# Patient Record
Sex: Male | Born: 1990 | Race: Black or African American | Hispanic: No | Marital: Married | Smoking: Never smoker
Health system: Southern US, Community
[De-identification: ages and names within clinical notes are randomized; demographics above are authoritative.]

## PROBLEM LIST (undated history)

## (undated) ENCOUNTER — Emergency Department (HOSPITAL_COMMUNITY): Admission: EM | Payer: Self-pay | Source: Home / Self Care

---

## 2013-07-10 ENCOUNTER — Emergency Department (HOSPITAL_COMMUNITY)
Admission: EM | Admit: 2013-07-10 | Discharge: 2013-07-10 | Discharge status: Absent without leave | Payer: Self-pay | Source: Home / Self Care

## 2014-03-04 ENCOUNTER — Emergency Department (HOSPITAL_COMMUNITY)
Admission: EM | Admit: 2014-03-04 | Discharge: 2014-03-04 | Disposition: A | Discharge status: Home / Self Care | Payer: Self-pay | Source: Home / Self Care

## 2015-06-02 ENCOUNTER — Emergency Department (HOSPITAL_COMMUNITY)
Admission: EM | Admit: 2015-06-02 | Discharge: 2015-06-02 | Disposition: A | Discharge status: Home / Self Care | Payer: BLUE CROSS/BLUE SHIELD | Attending: Emergency Medicine | Admitting: Emergency Medicine

## 2015-06-02 ENCOUNTER — Encounter (HOSPITAL_COMMUNITY): Payer: Self-pay | Admitting: Cardiology

## 2015-06-02 DIAGNOSIS — K219 Gastro-esophageal reflux disease without esophagitis: Secondary | ICD-10-CM | POA: Insufficient documentation

## 2015-06-02 DIAGNOSIS — R197 Diarrhea, unspecified: Secondary | ICD-10-CM

## 2015-06-02 DIAGNOSIS — R1032 Left lower quadrant pain: Secondary | ICD-10-CM | POA: Diagnosis present

## 2015-06-02 LAB — COMPREHENSIVE METABOLIC PANEL
ALBUMIN: 3.9 g/dL (ref 3.5–5.0)
ALK PHOS: 58 U/L (ref 38–126)
ALT: 27 U/L (ref 17–63)
AST: 25 U/L (ref 15–41)
Anion gap: 6 (ref 5–15)
BILIRUBIN TOTAL: 1.1 mg/dL (ref 0.3–1.2)
BUN: 9 mg/dL (ref 6–20)
CALCIUM: 9 mg/dL (ref 8.9–10.3)
CO2: 25 mmol/L (ref 22–32)
CREATININE: 0.71 mg/dL (ref 0.61–1.24)
Chloride: 108 mmol/L (ref 101–111)
GFR calc Af Amer: >60 mL/min (ref >60)
GFR calc non Af Amer: >60 mL/min (ref >60)
GLUCOSE: 88 mg/dL (ref 65–99)
POTASSIUM: 3.9 mmol/L (ref 3.5–5.1)
Sodium: 139 mmol/L (ref 135–145)
TOTAL PROTEIN: 6.7 g/dL (ref 6.5–8.1)

## 2015-06-02 LAB — URINALYSIS, ROUTINE W REFLEX MICROSCOPIC
BILIRUBIN URINE: NEGATIVE
Glucose, UA: NEGATIVE mg/dL
Hgb urine dipstick: NEGATIVE
KETONES UR: NEGATIVE mg/dL
Leukocytes, UA: NEGATIVE
NITRITE: NEGATIVE
PH: 6.5 (ref 5.0–8.0)
Protein, ur: NEGATIVE mg/dL
Specific Gravity, Urine: 1.005 (ref 1.005–1.030)

## 2015-06-02 LAB — CBC
HEMATOCRIT: 39.4 % (ref 39.0–52.0)
Hemoglobin: 13.7 g/dL (ref 13.0–17.0)
MCH: 30 pg (ref 26.0–34.0)
MCHC: 34.8 g/dL (ref 30.0–36.0)
MCV: 86.2 fL (ref 78.0–100.0)
Platelets: 158 10*3/uL (ref 150–400)
RBC: 4.57 MIL/uL (ref 4.22–5.81)
RDW: 12.2 % (ref 11.5–15.5)
WBC: 4.5 10*3/uL (ref 4.0–10.5)

## 2015-06-02 LAB — LIPASE, BLOOD: Lipase: 29 U/L (ref 11–51)

## 2015-06-02 MED ORDER — ONDANSETRON 4 MG PO TBDP: 4.0000 mg | ORAL_TABLET | Freq: Three times a day (TID) | ORAL | Status: DC | PRN

## 2015-06-02 MED ORDER — OMEPRAZOLE 20 MG PO CPDR: 20.0000 mg | DELAYED_RELEASE_CAPSULE | Freq: Every day | ORAL | Status: DC

## 2015-06-02 MED ORDER — LOPERAMIDE HCL 2 MG PO CAPS: 2.0000 mg | ORAL_CAPSULE | ORAL | Status: DC | PRN

## 2015-06-02 NOTE — ED Notes (Signed)
Pt reports left sided abd pain that started about 4 days ago. Also reports some nausea and burning sensation to the abd.

## 2015-06-02 NOTE — ED Provider Notes (Signed)
CSN: [id number]     Arrival date & time 06/02/15  1409 History  By signing my name below, I, Tanda RockersMargaux Venter, attest that this documentation has been prepared under the direction and in the presence of Everlene FarrierWilliam Katlin Ciszewski, PA-C. Electronically Signed: Tanda RockersMargaux Venter, ED Scribe. 06/02/2015. 4:01 PM.   Chief Complaint  Patient presents with  . Abdominal Pain   The history is provided by the patient. The history is limited by a language barrier. A language interpreter was used.     HPI Comments: Donald Prince is a 25 y.o. male who presents to the Emergency Department complaining of sudden onset, intermittent, LLQ abdominal pain x 2 days.  The pain is not worsened after a bowel movement. Pt also complains of an intermittent burning sensation in his epigastrium, abdominal bloating, nausea, and loose stools. He states he has had 1 episode of loose stools today. Pt has been taking OTC acid controlling medication without relief. He reports hx of gastritis and states this pain feels similar. He has been staying well hydrated today. Denies vomiting, fever, hematochezia, hematuria, dysuria, or any other associated symptoms. No hx kidney stones.  No previous abdominal surgeries.    History reviewed. No pertinent past medical history. History reviewed. No pertinent past surgical history. History reviewed. No pertinent family history. Social History  Substance Use Topics  . Smoking status: Never Smoker   . Smokeless tobacco: None  . Alcohol Use: No    Review of Systems  Constitutional: Negative for fever and chills.  HENT: Negative for congestion and sore throat.   Eyes: Negative for visual disturbance.  Respiratory: Negative for cough and shortness of breath.   Cardiovascular: Negative for chest pain.  Gastrointestinal: Positive for nausea, abdominal pain, diarrhea and abdominal distention. Negative for vomiting and blood in stool.  Genitourinary: Negative for dysuria, hematuria, flank pain and  difficulty urinating.  Musculoskeletal: Negative for back pain and neck pain.  Skin: Negative for rash.  Neurological: Negative for syncope, light-headedness and headaches.   Allergies  Review of patient's allergies indicates no known allergies.  Home Medications   Prior to Admission medications   Medication Sig Start Date End Date Taking? Authorizing Provider  loperamide (IMODIUM) 2 MG capsule Take 1 capsule (2 mg total) by mouth as needed for diarrhea or loose stools. 06/02/15   Everlene FarrierWilliam Raekwan Spelman, PA-C  omeprazole (PRILOSEC) 20 MG capsule Take 1 capsule (20 mg total) by mouth daily. 06/02/15   Everlene FarrierWilliam Tashica Provencio, PA-C  ondansetron (ZOFRAN ODT) 4 MG disintegrating tablet Take 1 tablet (4 mg total) by mouth every 8 (eight) hours as needed for nausea or vomiting. 06/02/15   Everlene FarrierWilliam Airel Magadan, PA-C   BP 106/67 mmHg  Pulse 65  Temp(Src) 98.5 F (36.9 C) (Oral)  Resp 20  Wt 61.292 kg  SpO2 100%   Physical Exam  Constitutional: He appears well-developed and well-nourished. No distress.  HENT:  Head: Normocephalic and atraumatic.  Mouth/Throat: Oropharynx is clear and moist and mucous membranes are normal.  Eyes: Conjunctivae are normal. Pupils are equal, round, and reactive to light. Right eye exhibits no discharge. Left eye exhibits no discharge.  Neck: Neck supple.  Cardiovascular: Normal rate, regular rhythm, normal heart sounds and intact distal pulses.  Exam reveals no gallop and no friction rub.   No murmur heard. Pulmonary/Chest: Effort normal and breath sounds normal. No respiratory distress. He has no wheezes. He has no rales.  Abdominal: Soft. Bowel sounds are normal. He exhibits no distension. There is no tenderness. There is  no rebound, no guarding and no CVA tenderness.  Patient's abdomen is soft and nontender to palpation. Bowel sounds are present. No CVA or flank tenderness. No peritoneal signs. No psoas or obturator sign.  Musculoskeletal: He exhibits no edema or tenderness.   Lymphadenopathy:    He has no cervical adenopathy.  Neurological: He is alert. Coordination normal.  Skin: Skin is warm and dry. No rash noted. He is not diaphoretic. No erythema. No pallor.  Psychiatric: He has a normal mood and affect. His behavior is normal.  Nursing note and vitals reviewed.   ED Course  Procedures (including critical care time)  DIAGNOSTIC STUDIES: Oxygen Saturation is 99% on RA, normal by my interpretation.    COORDINATION OF CARE: 3:56 PM-Discussed treatment plan which includes Rx Omeprazole, Zofran, and Loperamide with pt at bedside and pt agreed to plan.   Labs Review Labs Reviewed  LIPASE, BLOOD  COMPREHENSIVE METABOLIC PANEL  CBC  URINALYSIS, ROUTINE W REFLEX MICROSCOPIC (NOT AT [facility])    Imaging Review No results found. I have personally reviewed and evaluated these lab results as part of my medical decision-making.   EKG Interpretation None      Filed Vitals:   06/02/15 1429 06/02/15 1432 06/02/15 1612  BP: 105/75  106/67  Pulse: 62  65  Temp: 98.8 F (37.1 C)  98.5 F (36.9 C)  TempSrc: Oral  Oral  Resp: 18  20  Weight:  61.292 kg   SpO2: 99%  100%     MDM   Meds given in ED:  Medications - No data to display  Discharge Medication List as of 06/02/2015  4:05 PM    START taking these medications   Details  loperamide (IMODIUM) 2 MG capsule Take 1 capsule (2 mg total) by mouth as needed for diarrhea or loose stools., Starting 06/02/2015, Until Discontinued, Print    omeprazole (PRILOSEC) 20 MG capsule Take 1 capsule (20 mg total) by mouth daily., Starting 06/02/2015, Until Discontinued, Print    ondansetron (ZOFRAN ODT) 4 MG disintegrating tablet Take 1 tablet (4 mg total) by mouth every 8 (eight) hours as needed for nausea or vomiting., Starting 06/02/2015, Until Discontinued, Print        Final diagnoses:  LLQ abdominal pain  Diarrhea, unspecified type  Gastroesophageal reflux disease, esophagitis presence not specified    This is a 25 y.o. male Who presents to the emergency department complaining of 2 days of left lower quadrant abdominal pain, abdominal bloating, intermittent nausea and some loose stools. He reports one loose stool today. He reports some intermittent nausea but none currently. He has been tolerating by mouth. He denies hematemesis or hematochezia. Denies any vomiting. No previous abdominal surgeries. On exam the patient is afebrile nontoxic appearing. His abdomen is soft and nontender to palpation. He has no CVA or flank tenderness. Lipase is within normal limits. CMP is within normal limits. CBC is within normal limits. Urine is within normal limits. No hematuria. I doubt kidney stone. Patient appears to have viral gastritis. He is tolerating by mouth. Will discharge with prescriptions for loperamide, Prilosec, and Zofran. I see no need for imaging studies at this time. I discussed strict and specific return precautions. I advised the patient to follow-up with their primary care provider this week. I advised the patient to return to the emergency department with new or worsening symptoms or new concerns. The patient verbalized understanding and agreement with plan.    I personally performed the services described in this  documentation, which was scribed in my presence. The recorded information has been reviewed and is accurate.         Everlene Farrier, PA-C 06/02/15 1623  Nelva Nay, MD 06/02/15 [phone]

## 2015-06-02 NOTE — Discharge Instructions (Signed)
Abdominal Pain, Adult Many things can cause abdominal pain. Usually, abdominal pain is not caused by a disease and will improve without treatment. It can often be observed and treated at home. Your health care provider will do a physical exam and possibly order blood tests and X-rays to help determine the seriousness of your pain. However, in many cases, more time must pass before a clear cause of the pain can be found. Before that point, your health care provider may not know if you need more testing or further treatment. HOME CARE INSTRUCTIONS Monitor your abdominal pain for any changes. The following actions may help to alleviate any discomfort you are experiencing:  Only take over-the-counter or prescription medicines as directed by your health care provider.  Do not take laxatives unless directed to do so by your health care provider.  Try a clear liquid diet (broth, tea, or water) as directed by your health care provider. Slowly move to a bland diet as tolerated. SEEK MEDICAL CARE IF:  You have unexplained abdominal pain.  You have abdominal pain associated with nausea or diarrhea.  You have pain when you urinate or have a bowel movement.  You experience abdominal pain that wakes you in the night.  You have abdominal pain that is worsened or improved by eating food.  You have abdominal pain that is worsened with eating fatty foods.  You have a fever. SEEK IMMEDIATE MEDICAL CARE IF:  Your pain does not go away within 2 hours.  You keep throwing up (vomiting).  Your pain is felt only in portions of the abdomen, such as the right side or the left lower portion of the abdomen.  You pass bloody or black tarry stools. MAKE SURE YOU:  Understand these instructions.  Will watch your condition.  Will get help right away if you are not doing well or get worse.   This information is not intended to replace advice given to you by your health care provider. Make sure you discuss  any questions you have with your health care provider.   Document Released: 12/20/2004 Document Revised: 12/01/2014 Document Reviewed: 11/19/2012 Elsevier Interactive Patient Education 2016 [place]. Gastroesophageal Reflux Disease, Adult Normally, food travels down the esophagus and stays in the stomach to be digested. However, when a person has gastroesophageal reflux disease (GERD), food and stomach acid move back up into the esophagus. When this happens, the esophagus becomes sore and inflamed. Over time, GERD can create small holes (ulcers) in the lining of the esophagus.  CAUSES This condition is caused by a problem with the muscle between the esophagus and the stomach (lower esophageal sphincter, or LES). Normally, the LES muscle closes after food passes through the esophagus to the stomach. When the LES is weakened or abnormal, it does not close properly, and that allows food and stomach acid to go back up into the esophagus. The LES can be weakened by certain dietary substances, medicines, and medical conditions, including:  Tobacco use.  Pregnancy.  Having a hiatal hernia.  Heavy alcohol use.  Certain foods and beverages, such as coffee, chocolate, onions, and peppermint. RISK FACTORS This condition is more likely to develop in:  People who have an increased body weight.  People who have connective tissue disorders.  People who use NSAID medicines. SYMPTOMS Symptoms of this condition include:  Heartburn.  Difficult or painful swallowing.  The feeling of having a lump in the throat.  Abitter taste in the mouth.  Bad breath.  Having a  large amount of saliva.  Having an upset or bloated stomach.  Belching.  Chest pain.  Shortness of breath or wheezing.  Ongoing (chronic) cough or a night-time cough.  Wearing away of tooth enamel.  Weight loss. Different conditions can cause chest pain. Make sure to see your health care provider if you experience  chest pain. DIAGNOSIS Your health care provider will take a medical history and perform a physical exam. To determine if you have mild or severe GERD, your health care provider may also monitor how you respond to treatment. You may also have other tests, including:  An endoscopy toexamine your stomach and esophagus with a small camera.  A test thatmeasures the acidity level in your esophagus.  A test thatmeasures how much pressure is on your esophagus.  A barium swallow or modified barium swallow to show the shape, size, and functioning of your esophagus. TREATMENT The goal of treatment is to help relieve your symptoms and to prevent complications. Treatment for this condition may vary depending on how severe your symptoms are. Your health care provider may recommend:  Changes to your diet.  Medicine.  Surgery. HOME CARE INSTRUCTIONS Diet  Follow a diet as recommended by your health care provider. This may involve avoiding foods and drinks such as:  Coffee and tea (with or without caffeine).  Drinks that containalcohol.  Energy drinks and sports drinks.  Carbonated drinks or sodas.  Chocolate and cocoa.  Peppermint and mint flavorings.  Garlic and onions.  Horseradish.  Spicy and acidic foods, including peppers, chili powder, curry powder, vinegar, hot sauces, and barbecue sauce.  Citrus fruit juices and citrus fruits, such as oranges, lemons, and limes.  Tomato-based foods, such as red sauce, chili, salsa, and pizza with red sauce.  Fried and fatty foods, such as donuts, french fries, potato chips, and high-fat dressings.  High-fat meats, such as hot dogs and fatty cuts of red and white meats, such as rib eye steak, sausage, ham, and bacon.  High-fat dairy items, such as whole milk, butter, and cream cheese.  Eat small, frequent meals instead of large meals.  Avoid drinking large amounts of liquid with your meals.  Avoid eating meals during the 2-3 hours  before bedtime.  Avoid lying down right after you eat.  Do not exercise right after you eat. General Instructions  Pay attention to any changes in your symptoms.  Take over-the-counter and prescription medicines only as told by your health care provider. Do not take aspirin, ibuprofen, or other NSAIDs unless your health care provider told you to do so.  Do not use any tobacco products, including cigarettes, chewing tobacco, and e-cigarettes. If you need help quitting, ask your health care provider.  Wear loose-fitting clothing. Do not wear anything tight around your waist that causes pressure on your abdomen.  Raise (elevate) the head of your bed 6 inches (15cm).  Try to reduce your stress, such as with yoga or meditation. If you need help reducing stress, ask your health care provider.  If you are overweight, reduce your weight to an amount that is healthy for you. Ask your health care provider for guidance about a safe weight loss goal.  Keep all follow-up visits as told by your health care provider. This is important. SEEK MEDICAL CARE IF:  You have new symptoms.  You have unexplained weight loss.  You have difficulty swallowing, or it hurts to swallow.  You have wheezing or a persistent cough.  Your symptoms do not improve  with treatment.  You have a hoarse voice. SEEK IMMEDIATE MEDICAL CARE IF:  You have pain in your arms, neck, jaw, teeth, or back.  You feel sweaty, dizzy, or light-headed.  You have chest pain or shortness of breath.  You vomit and your vomit looks like blood or coffee grounds.  You faint.  Your stool is bloody or black.  You cannot swallow, drink, or eat.   This information is not intended to replace advice given to you by your health care provider. Make sure you discuss any questions you have with your health care provider.   Document Released: 12/20/2004 Document Revised: 12/01/2014 Document Reviewed: 07/07/2014 Elsevier Interactive  Patient Education 2016 [employer] Inc[employer]  Diarrhea Diarrhea is frequent loose and watery bowel movements. It can cause you to feel weak and dehydrated. Dehydration can cause you to become tired and thirsty, have a dry mouth, and have decreased urination that often is dark yellow. Diarrhea is a sign of another problem, most often an infection that will not last long. In most cases, diarrhea typically lasts 2-3 days. However, it can last longer if it is a sign of something more serious. It is important to treat your diarrhea as directed by your caregiver to lessen or prevent future episodes of diarrhea. CAUSES  Some common causes include:  Gastrointestinal infections caused by viruses, bacteria, or parasites.  Food poisoning or food allergies.  Certain medicines, such as antibiotics, chemotherapy, and laxatives.  Artificial sweeteners and fructose.  Digestive disorders. HOME CARE INSTRUCTIONS  Ensure adequate fluid intake (hydration): Have 1 cup (8 oz) of fluid for each diarrhea episode. Avoid fluids that contain simple sugars or sports drinks, fruit juices, whole milk products, and sodas. Your urine should be clear or pale yellow if you are drinking enough fluids. Hydrate with an oral rehydration solution that you can purchase at pharmacies, retail stores, and online. You can prepare an oral rehydration solution at home by mixing the following ingredients together:   - tsp table salt.   tsp baking soda.   tsp salt substitute containing potassium chloride.  1  tablespoons sugar.  1 L (34 oz) of water.  Certain foods and beverages may increase the speed at which food moves through the gastrointestinal (GI) tract. These foods and beverages should be avoided and include:  Caffeinated and alcoholic beverages.  High-fiber foods, such as raw fruits and vegetables, nuts, seeds, and whole grain breads and cereals.  Foods and beverages sweetened with sugar alcohols, such as xylitol, sorbitol,  and mannitol.  Some foods may be well tolerated and may help thicken stool including:  Starchy foods, such as rice, toast, pasta, low-sugar cereal, oatmeal, grits, baked potatoes, crackers, and bagels.  Bananas.  Applesauce.  Add probiotic-rich foods to help increase healthy bacteria in the GI tract, such as yogurt and fermented milk products.  Wash your hands well after each diarrhea episode.  Only take over-the-counter or prescription medicines as directed by your caregiver.  Take a warm bath to relieve any burning or pain from frequent diarrhea episodes. SEEK IMMEDIATE MEDICAL CARE IF:   You are unable to keep fluids down.  You have persistent vomiting.  You have blood in your stool, or your stools are black and tarry.  You do not urinate in 6-8 hours, or there is only a small amount of very dark urine.  You have abdominal pain that increases or localizes.  You have weakness, dizziness, confusion, or light-headedness.  You have a severe headache.  Your  diarrhea gets worse or does not get better.  You have a fever or persistent symptoms for more than 2-3 days.  You have a fever and your symptoms suddenly get worse. MAKE SURE YOU:   Understand these instructions.  Will watch your condition.  Will get help right away if you are not doing well or get worse.   This information is not intended to replace advice given to you by your health care provider. Make sure you discuss any questions you have with your health care provider.   Document Released: 03/02/2002 Document Revised: 04/02/2014 Document Reviewed: 11/18/2011 Elsevier Interactive Patient Education [employer]

## 2016-08-29 ENCOUNTER — Ambulatory Visit (HOSPITAL_COMMUNITY)
Admission: EM | Admit: 2016-08-29 | Discharge: 2016-08-29 | Disposition: A | Discharge status: Home / Self Care | Payer: BLUE CROSS/BLUE SHIELD | Attending: Internal Medicine | Admitting: Internal Medicine

## 2016-08-29 ENCOUNTER — Encounter (HOSPITAL_COMMUNITY): Payer: Self-pay | Admitting: Emergency Medicine

## 2016-08-29 ENCOUNTER — Ambulatory Visit (INDEPENDENT_AMBULATORY_CARE_PROVIDER_SITE_OTHER): Payer: BLUE CROSS/BLUE SHIELD

## 2016-08-29 DIAGNOSIS — K5900 Constipation, unspecified: Secondary | ICD-10-CM | POA: Diagnosis not present

## 2016-08-29 DIAGNOSIS — R109 Unspecified abdominal pain: Secondary | ICD-10-CM | POA: Diagnosis not present

## 2016-08-29 LAB — POCT URINALYSIS DIP (DEVICE)
BILIRUBIN URINE: NEGATIVE
GLUCOSE, UA: NEGATIVE mg/dL
Hgb urine dipstick: NEGATIVE
KETONES UR: NEGATIVE mg/dL
Leukocytes, UA: NEGATIVE
NITRITE: NEGATIVE
PROTEIN: NEGATIVE mg/dL
Specific Gravity, Urine: 1.01 (ref 1.005–1.030)
Urobilinogen, UA: 0.2 mg/dL (ref 0.0–1.0)
pH: 7 (ref 5.0–8.0)

## 2016-08-29 MED ORDER — MAGNESIUM CITRATE PO SOLN: ORAL | 0 refills | Status: DC

## 2016-08-29 NOTE — ED Triage Notes (Signed)
Left abdominal pain, onset yesterday evening.  Denies vomiting, last bm was yesterday.

## 2016-08-29 NOTE — ED Provider Notes (Signed)
CSN: [id number]     Arrival date & time 08/29/16  4098 History   None    Chief Complaint  Patient presents with  . Abdominal Pain   (Consider location/radiation/quality/duration/timing/severity/associated sxs/prior Treatment) Patient c/o left abdominal pain since last night.  Denies NVD or fever.  States last BM was yesterday.   The history is provided by the patient.  Abdominal Pain  Pain location:  LUQ Pain quality: aching   Pain radiates to:  LLQ Pain severity:  Mild Onset quality:  Sudden Duration:  1 day Timing:  Constant Progression:  Worsening Chronicity:  New Relieved by:  Nothing Worsened by:  Nothing Ineffective treatments:  None tried Associated symptoms: fatigue     History reviewed. No pertinent past medical history. History reviewed. No pertinent surgical history. No family history on file. Social History  Substance Use Topics  . Smoking status: Never Smoker  . Smokeless tobacco: Not on file  . Alcohol use No    Review of Systems  Constitutional: Positive for fatigue.  Eyes: Negative.   Respiratory: Negative.   Cardiovascular: Negative.   Gastrointestinal: Positive for abdominal pain.  Endocrine: Negative.   Genitourinary: Negative.   Musculoskeletal: Negative.   Allergic/Immunologic: Negative.   Neurological: Negative.   Hematological: Negative.   Psychiatric/Behavioral: Negative.     Allergies  Patient has no known allergies.  Home Medications   Prior to Admission medications   Medication Sig Start Date End Date Taking? Authorizing Provider  loperamide (IMODIUM) 2 MG capsule Take 1 capsule (2 mg total) by mouth as needed for diarrhea or loose stools. 06/02/15   Everlene Farrier, PA-C  magnesium citrate SOLN Drink one bottle now and repeat in 4 hours if no results. 08/29/16   Deatra Canter, FNP  omeprazole (PRILOSEC) 20 MG capsule Take 1 capsule (20 mg total) by mouth daily. 06/02/15   Everlene Farrier, PA-C  ondansetron (ZOFRAN ODT) 4 MG  disintegrating tablet Take 1 tablet (4 mg total) by mouth every 8 (eight) hours as needed for nausea or vomiting. 06/02/15   Everlene Farrier, PA-C   Meds Ordered and Administered this Visit  Medications - No data to display  BP 103/66 (BP Location: Right Arm) Comment: small cuff  Pulse 67   Temp 98.6 F (37 C)   Resp 18   SpO2 100%  No data found.   Physical Exam  Constitutional: He appears well-developed and well-nourished.  HENT:  Head: Normocephalic and atraumatic.  Eyes: Conjunctivae and EOM are normal. Pupils are equal, round, and reactive to light.  Neck: Normal range of motion. Neck supple.  Cardiovascular: Normal rate, regular rhythm and normal heart sounds.   Pulmonary/Chest: Effort normal and breath sounds normal.  Abdominal: Soft. Bowel sounds are normal. There is tenderness.  Tenderness left abdomen.  Nursing note and vitals reviewed.   Urgent Care Course     Procedures (including critical care time)  Labs Review Labs Reviewed  POCT URINALYSIS DIP (DEVICE)    Imaging Review Dg Abd 1 View  Result Date: 08/29/2016 CLINICAL DATA:  Left-sided abdominal pain began yesterday with no associated symptoms. History of constipation. EXAM: ABDOMEN - 1 VIEW COMPARISON:  None in PACs FINDINGS: The colonic and rectal stool burden is moderate. There is no small or large bowel obstructive pattern. There is no evidence of fecal impaction. There is a 2 mm diameter calcification that projects over the left renal fossa. There are phleboliths in the left aspect of pelvis. The bony structures are unremarkable. IMPRESSION: Moderately  increased colonic stool burden may reflect constipation in the appropriate clinical setting. 2 mm diameter calcification projecting over the left renal fossa could reflect a kidney stone. Correlation with the patient's history and symptoms symptoms and the presence or absence of hematuria would be useful. Electronically Signed   By: David  SwazilandJordan M.D.   On:  08/29/2016 10:46     Visual Acuity Review  Right Eye Distance:   Left Eye Distance:   Bilateral Distance:    Right Eye Near:   Left Eye Near:    Bilateral Near:         MDM   1. Constipation, unspecified constipation type    Magnesium citrate drink one bottle now and repeat in 4 hours prn       Deatra CanterOxford, William J, OregonFNP 08/29/16 1132

## 2018-08-24 IMAGING — DX DG ABDOMEN 1V
1 series · 1 of 1 positions shown · non-contrast
Comparison: None in PACs

CLINICAL DATA: Left-sided abdominal pain began yesterday with no
associated symptoms. History of constipation.

EXAM:
ABDOMEN - 1 VIEW

[abdomen kub]
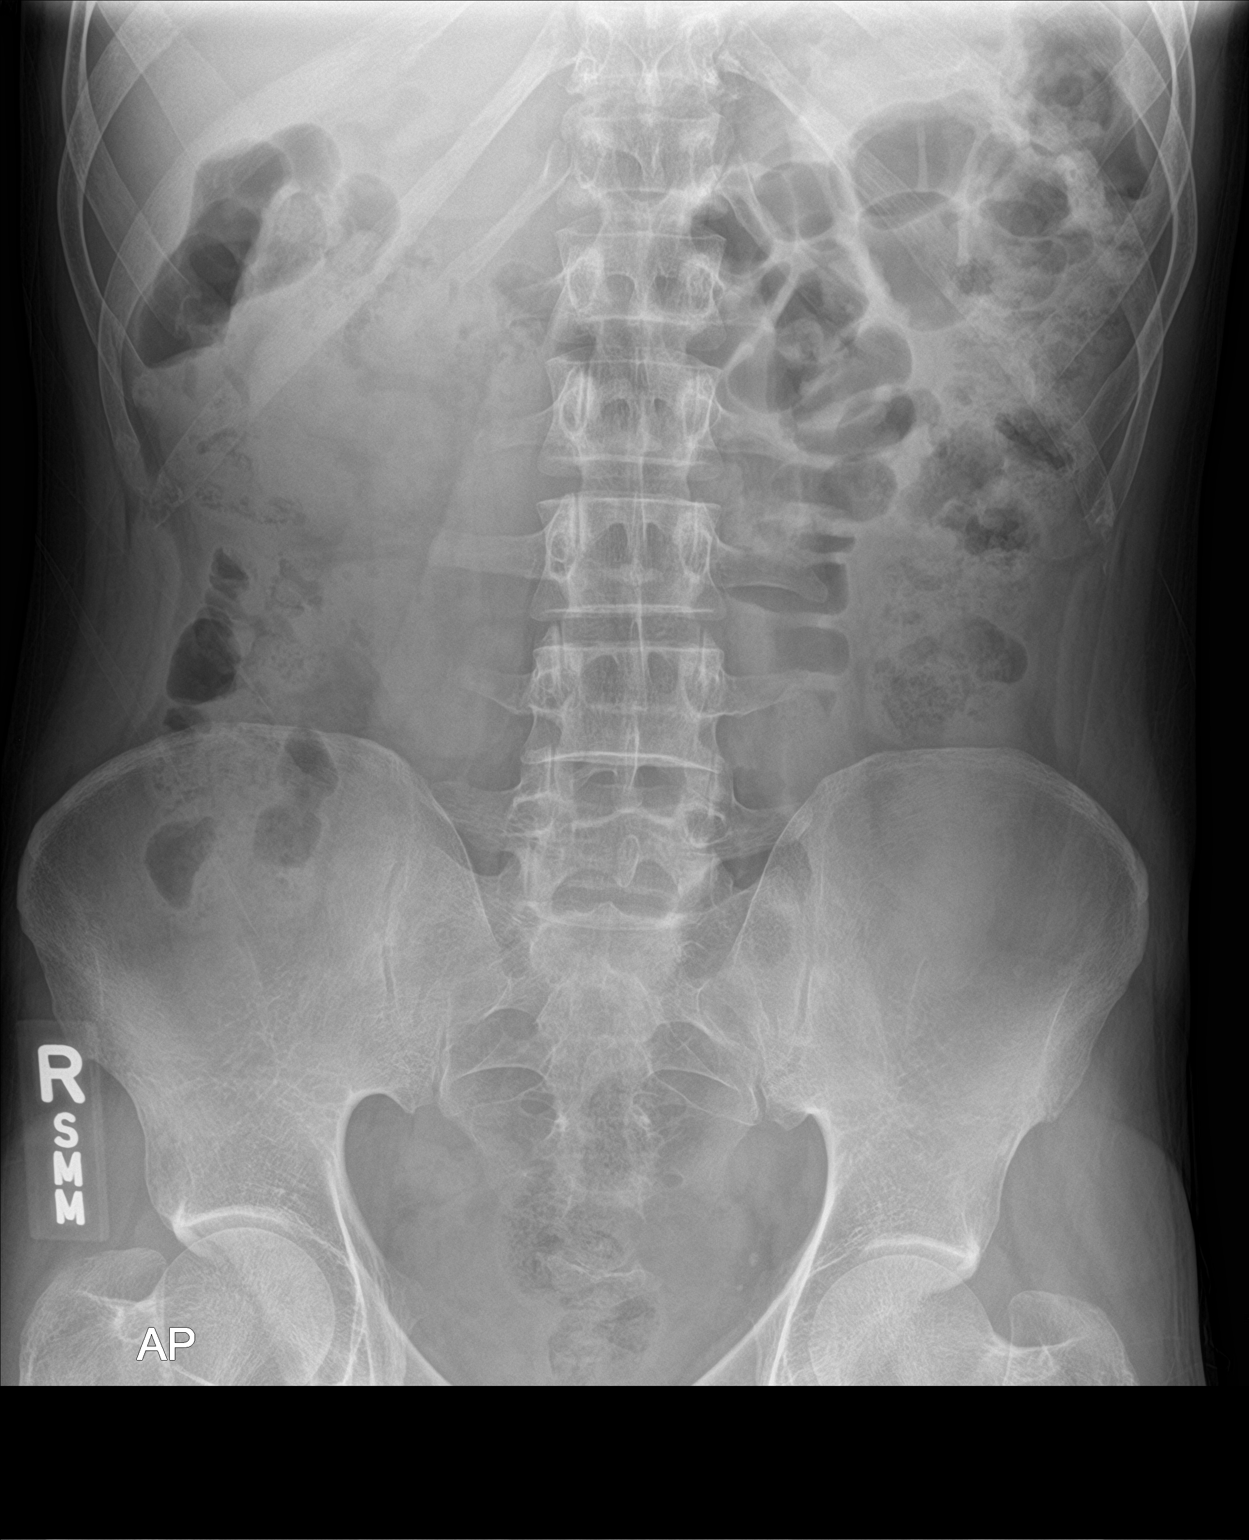

[1 of 1 positions shown; findings below may reference images not displayed]

FINDINGS: The colonic and rectal stool burden is moderate. There is no small
or large bowel obstructive pattern. There is no evidence of fecal
impaction. There is a 2 mm diameter calcification that projects over
the left renal fossa. There are phleboliths in the left aspect of
pelvis. The bony structures are unremarkable.
IMPRESSION: Moderately increased colonic stool burden may reflect constipation
in the appropriate clinical setting.

2 mm diameter calcification projecting over the left renal fossa
could reflect a kidney stone. Correlation with the patient's history
and symptoms symptoms and the presence or absence of hematuria would
be useful.

## 2022-11-20 ENCOUNTER — Emergency Department (HOSPITAL_COMMUNITY): Payer: BLUE CROSS/BLUE SHIELD

## 2022-11-20 ENCOUNTER — Other Ambulatory Visit: Payer: Self-pay

## 2022-11-20 ENCOUNTER — Emergency Department (HOSPITAL_COMMUNITY)
Admission: EM | Admit: 2022-11-20 | Discharge: 2022-11-20 | Disposition: A | Discharge status: Home / Self Care | Payer: BLUE CROSS/BLUE SHIELD | Attending: Emergency Medicine | Admitting: Emergency Medicine

## 2022-11-20 ENCOUNTER — Encounter (HOSPITAL_COMMUNITY): Payer: Self-pay

## 2022-11-20 DIAGNOSIS — R7309 Other abnormal glucose: Secondary | ICD-10-CM | POA: Insufficient documentation

## 2022-11-20 DIAGNOSIS — M549 Dorsalgia, unspecified: Secondary | ICD-10-CM | POA: Diagnosis not present

## 2022-11-20 DIAGNOSIS — R110 Nausea: Secondary | ICD-10-CM

## 2022-11-20 DIAGNOSIS — R300 Dysuria: Secondary | ICD-10-CM | POA: Diagnosis not present

## 2022-11-20 DIAGNOSIS — R1084 Generalized abdominal pain: Secondary | ICD-10-CM | POA: Diagnosis not present

## 2022-11-20 DIAGNOSIS — R109 Unspecified abdominal pain: Secondary | ICD-10-CM | POA: Diagnosis present

## 2022-11-20 DIAGNOSIS — Z20822 Contact with and (suspected) exposure to covid-19: Secondary | ICD-10-CM | POA: Insufficient documentation

## 2022-11-20 DIAGNOSIS — R0789 Other chest pain: Secondary | ICD-10-CM

## 2022-11-20 DIAGNOSIS — R112 Nausea with vomiting, unspecified: Secondary | ICD-10-CM | POA: Insufficient documentation

## 2022-11-20 LAB — URINALYSIS, ROUTINE W REFLEX MICROSCOPIC
Bilirubin Urine: NEGATIVE
Glucose, UA: NEGATIVE mg/dL
Hgb urine dipstick: NEGATIVE
Ketones, ur: 20 mg/dL — AB
Leukocytes,Ua: NEGATIVE
Nitrite: NEGATIVE
Protein, ur: NEGATIVE mg/dL
Specific Gravity, Urine: 1.026 (ref 1.005–1.030)
pH: 5 (ref 5.0–8.0)

## 2022-11-20 LAB — CBC
HCT: 42.2 % (ref 39.0–52.0)
Hemoglobin: 14.4 g/dL (ref 13.0–17.0)
MCH: 29.5 pg (ref 26.0–34.0)
MCHC: 34.1 g/dL (ref 30.0–36.0)
MCV: 86.5 fL (ref 80.0–100.0)
Platelets: 178 10*3/uL (ref 150–400)
RBC: 4.88 MIL/uL (ref 4.22–5.81)
RDW: 12.3 % (ref 11.5–15.5)
WBC: 8.1 10*3/uL (ref 4.0–10.5)
nRBC: 0 % (ref 0.0–0.2)

## 2022-11-20 LAB — BASIC METABOLIC PANEL
Anion gap: 15 (ref 5–15)
BUN: 16 mg/dL (ref 6–20)
CO2: 23 mmol/L (ref 22–32)
Calcium: 9 mg/dL (ref 8.9–10.3)
Chloride: 98 mmol/L (ref 98–111)
Creatinine, Ser: 0.86 mg/dL (ref 0.61–1.24)
GFR, Estimated: >60 mL/min (ref >60)
Glucose, Bld: 103 mg/dL — ABNORMAL HIGH (ref 70–99)
Potassium: 3.9 mmol/L (ref 3.5–5.1)
Sodium: 136 mmol/L (ref 135–145)

## 2022-11-20 LAB — RESP PANEL BY RT-PCR (RSV, FLU A&B, COVID)  RVPGX2
Influenza A by PCR: NEGATIVE
Influenza B by PCR: NEGATIVE
Resp Syncytial Virus by PCR: NEGATIVE
SARS Coronavirus 2 by RT PCR: NEGATIVE

## 2022-11-20 LAB — LIPASE, BLOOD: Lipase: 22 U/L (ref 11–51)

## 2022-11-20 LAB — TROPONIN I (HIGH SENSITIVITY)
Troponin I (High Sensitivity): 3 ng/L (ref <18)
Troponin I (High Sensitivity): 3 ng/L (ref <18)

## 2022-11-20 MED ORDER — LACTATED RINGERS IV BOLUS
1000.0000 mL | Freq: Once | INTRAVENOUS | Status: AC
  Administered 2022-11-20: 1000 mL via INTRAVENOUS

## 2022-11-20 MED ORDER — ONDANSETRON HCL 4 MG/2ML IJ SOLN
4.0000 mg | Freq: Once | INTRAMUSCULAR | Status: AC
  Administered 2022-11-20: 4 mg via INTRAVENOUS
  Filled 2022-11-20: qty 2

## 2022-11-20 MED ORDER — MORPHINE SULFATE (PF) 4 MG/ML IV SOLN
4.0000 mg | Freq: Once | INTRAVENOUS | Status: AC
  Administered 2022-11-20: 4 mg via INTRAVENOUS
  Filled 2022-11-20: qty 1

## 2022-11-20 MED ORDER — ONDANSETRON 4 MG PO TBDP: 4.0000 mg | ORAL_TABLET | Freq: Three times a day (TID) | ORAL | 0 refills | Status: AC | PRN

## 2022-11-20 MED ORDER — IOHEXOL 350 MG/ML SOLN
100.0000 mL | Freq: Once | INTRAVENOUS | Status: AC | PRN
  Administered 2022-11-20: 100 mL via INTRAVENOUS

## 2022-11-20 NOTE — ED Triage Notes (Signed)
Pt c/o fatigue, HA, N/V, abd, chest, and low back pain since this am; denies fevers; endorses dysuria, denies cough; denies medical hx

## 2022-11-20 NOTE — ED Provider Notes (Signed)
[place] EMERGENCY DEPARTMENT AT [facility] Provider Note   CSN: [id number] Arrival date & time: 11/20/22  1326     History  Chief Complaint  Patient presents with   Abdominal Pain   Chest Pain    Donald Prince is a 32 y.o. male.  32 year old male presents today for evaluation of abdominal pain, back pain, dysuria, and just overall sensation of not feeling well.  This started at 630 this morning.  States he has had 2 episodes of dysuria.  Denies any hematuria.  Endorses nausea and vomiting but no hematemesis.  No blood in stool.  Denies any significant past medical history.  The history is provided by the patient. No language interpreter was used.       Home Medications Prior to Admission medications   Medication Sig Start Date End Date Taking? Authorizing Provider  loperamide (IMODIUM) 2 MG capsule Take 1 capsule (2 mg total) by mouth as needed for diarrhea or loose stools. 06/02/15   Everlene Farrier, PA-C  magnesium citrate SOLN Drink one bottle now and repeat in 4 hours if no results. 08/29/16   Deatra Canter, FNP  omeprazole (PRILOSEC) 20 MG capsule Take 1 capsule (20 mg total) by mouth daily. 06/02/15   Everlene Farrier, PA-C  ondansetron (ZOFRAN ODT) 4 MG disintegrating tablet Take 1 tablet (4 mg total) by mouth every 8 (eight) hours as needed for nausea or vomiting. 06/02/15   Everlene Farrier, PA-C      Allergies    Patient has no known allergies.    Review of Systems   Review of Systems  Constitutional:  Negative for chills and fever.  Respiratory:  Negative for cough and shortness of breath.   Cardiovascular:  Negative for chest pain.  Gastrointestinal:  Positive for abdominal pain, nausea and vomiting.  Genitourinary:  Positive for dysuria and flank pain.  Neurological:  Negative for light-headedness.  All other systems reviewed and are negative.   Physical Exam Updated Vital Signs BP 106/62   Pulse 93   Temp 99.2 F (37.3 C)   Resp (!) 27    Ht 5\' 8"  (1.727 m)   Wt 65.8 kg   SpO2 100%   BMI 22.05 kg/m  Physical Exam Vitals and nursing note reviewed.  Constitutional:      General: He is not in acute distress.    Appearance: Normal appearance. He is not ill-appearing.  HENT:     Head: Normocephalic and atraumatic.     Nose: Nose normal.  Eyes:     General: No scleral icterus.    Extraocular Movements: Extraocular movements intact.     Conjunctiva/sclera: Conjunctivae normal.  Cardiovascular:     Rate and Rhythm: Normal rate and regular rhythm.     Heart sounds: Normal heart sounds.  Pulmonary:     Effort: Pulmonary effort is normal. No respiratory distress.     Breath sounds: Normal breath sounds. No wheezing or rales.  Abdominal:     General: There is no distension.     Palpations: Abdomen is soft.     Tenderness: There is abdominal tenderness. There is right CVA tenderness. There is no guarding.  Musculoskeletal:        General: Normal range of motion.     Cervical back: Normal range of motion.  Skin:    General: Skin is warm and dry.  Neurological:     General: No focal deficit present.     Mental Status: He is alert. Mental status  is at baseline.     ED Results / Procedures / Treatments   Labs (all labs ordered are listed, but only abnormal results are displayed) Labs Reviewed  BASIC METABOLIC PANEL - Abnormal; Notable for the following components:      Result Value   Glucose, Bld 103 (*)    All other components within normal limits  URINALYSIS, ROUTINE W REFLEX MICROSCOPIC - Abnormal; Notable for the following components:   Ketones, ur 20 (*)    All other components within normal limits  RESP PANEL BY RT-PCR (RSV, FLU A&B, COVID)  RVPGX2  CBC  LIPASE, BLOOD  TROPONIN I (HIGH SENSITIVITY)  TROPONIN I (HIGH SENSITIVITY)    EKG None  Radiology DG Chest 2 View  Result Date: 11/20/2022 CLINICAL DATA:  Chest pain. EXAM: CHEST - 2 VIEW COMPARISON:  None Available. FINDINGS: Normal cardiac  silhouette and mediastinal contours. No focal parenchymal opacities. No pleural effusion or pneumothorax. Pacer leads overlie the upper lungs bilaterally. No evidence of edema. No acute osseous abnormalities. IMPRESSION: No acute cardiopulmonary disease. Specifically, no evidence of pneumonia. Electronically Signed   By: Simonne Come M.D.   On: 11/20/2022 15:18    Procedures Procedures    Medications Ordered in ED Medications  lactated ringers bolus 1,000 mL (has no administration in time range)  ondansetron (ZOFRAN) injection 4 mg (has no administration in time range)  morphine (PF) 4 MG/ML injection 4 mg (has no administration in time range)    ED Course/ Medical Decision Making/ A&P                                 Medical Decision Making Amount and/or Complexity of Data Reviewed Labs: ordered. Radiology: ordered.  Risk Prescription drug management.   Medical Decision Making / ED Course   This patient presents to the ED for concern of nausea, vomiting, abdominal pain, this involves an extensive number of treatment options, and is a complaint that carries with it a high risk of complications and morbidity.  The differential diagnosis includes gastritis, pancreatitis, UTI, pyelonephritis, nephrolithiasis, ACS, viral URI  MDM: 32 year old male presents today for evaluation of abdominal pain, back pain, lower chest pain.  Endorses some flank pain, dysuria.  Has had some nausea and vomiting this morning.  All of his symptom started around 6:30 AM this morning.  Denies any additional symptoms.  No recent travel.  Hemodynamically stable throughout the emergency room stay.  Afebrile.  Full range of motion and has been ambulating within the room without difficulty.  CBC is unremarkable, BMP shows glucose of 103 otherwise unremarkable.  COVID, flu, RSV negative.  Lipase within normal limits.  Troponin negative x 2.  UA without evidence of UTI.  CT renal stone study without evidence of kidney  stone, or other acute intra-abdominal process.  Chest x-ray without acute cardiopulmonary process.  EKG without acute ischemic changes.  Given the constellation of symptoms including chest pain, abdominal pain, back pain, and soft blood pressure CT angio chest, abdomen, pelvis was obtained.  No evidence of dissection or other acute process.  Blood pressure improved after fluid bolus.  Patient is appropriate for discharge.  PCP referral given.  Return precautions discussed.  Patient voices understanding and is in agreement with plan.  Lab Tests: -I ordered, reviewed, and interpreted labs.   The pertinent results include:   Labs Reviewed  BASIC METABOLIC PANEL - Abnormal; Notable for the following components:  Result Value   Glucose, Bld 103 (*)    All other components within normal limits  URINALYSIS, ROUTINE W REFLEX MICROSCOPIC - Abnormal; Notable for the following components:   Ketones, ur 20 (*)    All other components within normal limits  RESP PANEL BY RT-PCR (RSV, FLU A&B, COVID)  RVPGX2  CBC  LIPASE, BLOOD  TROPONIN I (HIGH SENSITIVITY)  TROPONIN I (HIGH SENSITIVITY)      EKG  EKG Interpretation Date/Time:  Tuesday November 20 2022 14:00:43 EDT Ventricular Rate:  89 PR Interval:  134 QRS Duration:  88 QT Interval:  340 QTC Calculation: 413 R Axis:   80  Text Interpretation: Normal sinus rhythm Normal ECG No previous ECGs available Confirmed by Fulton Reek [phone]) on 11/20/2022 10:14:35 PM         Imaging Studies ordered: I ordered imaging studies including CT renal stone study, chest x-ray, CT angio chest, abdomen, pelvis dissection study I independently visualized and interpreted imaging. I agree with the radiologist interpretation   Medicines ordered and prescription drug management: Meds ordered this encounter  Medications   lactated ringers bolus 1,000 mL   ondansetron (ZOFRAN) injection 4 mg   morphine (PF) 4 MG/ML injection 4 mg   ondansetron  (ZOFRAN) injection 4 mg   lactated ringers bolus 1,000 mL   iohexol (OMNIPAQUE) 350 MG/ML injection 100 mL    -I have reviewed the patients home medicines and have made adjustments as needed  Critical interventions Fluid bolus  Cardiac Monitoring: The patient was maintained on a cardiac monitor.  I personally viewed and interpreted the cardiac monitored which showed an underlying rhythm of: Normal sinus rhythm  Reevaluation: After the interventions noted above, I reevaluated the patient and found that they have :improved  Co morbidities that complicate the patient evaluation History reviewed. No pertinent past medical history.    Dispostion: Patient discharged in stable condition.  Return precaution discussed.  Patient voices understanding and is in agreement with plan.   Final Clinical Impression(s) / ED Diagnoses Final diagnoses:  Generalized abdominal pain    Rx / DC Orders ED Discharge Orders          Ordered    ondansetron (ZOFRAN-ODT) 4 MG disintegrating tablet  Every 8 hours PRN        11/20/22 2226              Marita Kansas, PA-C 11/20/22 2229    Laurence Spates, MD 11/21/22 [phone]

## 2022-11-20 NOTE — ED Notes (Signed)
Patient given something to drink.

## 2022-11-20 NOTE — Discharge Instructions (Addendum)
Your workup today was overall reassuring.  No concerning findings on the CT imaging, blood work.  You likely have a viral syndrome.  I have sent nausea medication into the pharmacy for you.  Establish care with the primary care provider listed above.  For any concerning symptoms return to the emergency room.

## 2024-02-13 ENCOUNTER — Emergency Department (HOSPITAL_COMMUNITY): Payer: Self-pay

## 2024-02-13 ENCOUNTER — Emergency Department (HOSPITAL_COMMUNITY)
Admission: EM | Admit: 2024-02-13 | Discharge: 2024-02-14 | Disposition: A | Discharge status: Home / Self Care | Payer: Self-pay | Attending: Emergency Medicine | Admitting: Emergency Medicine

## 2024-02-13 ENCOUNTER — Other Ambulatory Visit: Payer: Self-pay

## 2024-02-13 DIAGNOSIS — S4991XA Unspecified injury of right shoulder and upper arm, initial encounter: Secondary | ICD-10-CM | POA: Diagnosis present

## 2024-02-13 DIAGNOSIS — W1849XA Other slipping, tripping and stumbling without falling, initial encounter: Secondary | ICD-10-CM | POA: Insufficient documentation

## 2024-02-13 DIAGNOSIS — M25521 Pain in right elbow: Secondary | ICD-10-CM | POA: Insufficient documentation

## 2024-02-13 NOTE — ED Provider Triage Note (Signed)
 Emergency Medicine Provider Triage Evaluation Note  Donald Prince , a 33 y.o. male  was evaluated in triage.  Pt complains of right elbow pain x 4 days.  Review of Systems  Positive: Right elbow pain Negative: Numbness, tingling, fever, chills, nausea, vomiting  Physical Exam  BP 110/72 (BP Location: Right Arm)   Pulse 67   Temp 97.8 F (36.6 C)   Resp 18   SpO2 100%  Gen:   Awake, no distress   Resp:  Normal effort  MSK:   Moves extremities without difficulty  Other:  No signs of infection including erythema, swelling, or warmth.  Medical Decision Making  Medically screening exam initiated at 7:00 PM.  Appropriate orders placed.  Donald Prince was informed that the remainder of the evaluation will be completed by another provider, this initial triage assessment does not replace that evaluation, and the importance of remaining in the ED until their evaluation is complete.  Imaging ordered   Donald Prince 02/13/24 1901

## 2024-02-13 NOTE — ED Triage Notes (Addendum)
 Pt to ED via POV, [place] tigrinian interpretor used to assist with triage. Pt c/o right elbow pain x 4 days. Reports injured self when trying to change tire. No swelling or redness noted. Full ROM

## 2024-02-14 MED ORDER — OXYCODONE-ACETAMINOPHEN 5-325 MG PO TABS
1.0000 | ORAL_TABLET | Freq: Once | ORAL | Status: AC
  Administered 2024-02-14: 1 via ORAL
  Filled 2024-02-14: qty 1

## 2024-02-14 MED ORDER — OXYCODONE-ACETAMINOPHEN 5-325 MG PO TABS: 1.0000 | ORAL_TABLET | Freq: Four times a day (QID) | ORAL | 0 refills | Status: AC | PRN

## 2024-02-14 MED ORDER — OXYCODONE-ACETAMINOPHEN 5-325 MG PO TABS
1.0000 | ORAL_TABLET | Freq: Four times a day (QID) | ORAL | 0 refills | Status: DC | PRN
Start: 2024-02-14 — End: 2024-02-14

## 2024-02-14 NOTE — ED Provider Notes (Cosign Needed Addendum)
 [name] EMERGENCY DEPARTMENT AT [facility] Provider Note   CSN: [id number] Arrival date & time: 02/13/24  8162     Patient presents with: Arm Injury   Donald Prince is a 33 y.o. male that states that he was trying to change the tire on his vehicle and while using the tire and felt a popping sensation in his right elbow with significant pain since that time with full range of motion primarily with extremes of flexion of the elbow.  No numbness tingling or weakness in the hand.  Tylenol  at home with minimal improvement in his symptoms.  No falls, impact to the elbow, or history injury to the elbow in the past.   HPI     Prior to Admission medications   Medication Sig Start Date End Date Taking? Authorizing Provider  oxyCODONE -acetaminophen  (PERCOCET/ROXICET) 5-325 MG tablet Take 1 tablet by mouth every 6 (six) hours as needed for severe pain (pain score 7-10). 02/14/24  Yes Lakethia Coppess R, PA-C  ondansetron  (ZOFRAN -ODT) 4 MG disintegrating tablet Take 1 tablet (4 mg total) by mouth every 8 (eight) hours as needed. 11/20/22   Hildegard Loge, PA-C    Allergies: Patient has no known allergies.    Review of Systems  Musculoskeletal:        R elbow pain without deformity    Updated Vital Signs BP 113/71   Pulse (!) 55   Temp 98.2 F (36.8 C) (Oral)   Resp 18   SpO2 100%   Physical Exam Vitals and nursing note reviewed.  Constitutional:      Appearance: He is not ill-appearing or toxic-appearing.  HENT:     Head: Normocephalic and atraumatic.  Eyes:     General: No scleral icterus.       Right eye: No discharge.        Left eye: No discharge.     Conjunctiva/sclera: Conjunctivae normal.  Pulmonary:     Effort: Pulmonary effort is normal.  Musculoskeletal:     Right shoulder: Normal.     Left shoulder: Normal.     Right upper arm: Normal.     Left upper arm: Normal.     Right elbow: No deformity, effusion or lacerations. Normal range of motion.  Tenderness present in olecranon process.     Left elbow: Normal.     Right forearm: Normal.     Left forearm: Normal.     Right wrist: Normal.     Left wrist: Normal.     Right hand: Normal.     Left hand: Normal.     Comments: Full active range of motion of the elbow in all planes including pronation and supination.  Pain only elicited with most extreme flexion of the elbow and pain at that time is isolated to the olecranon.  TTP over the olecranon with  Skin:    General: Skin is warm and dry.  Neurological:     General: No focal deficit present.     Mental Status: He is alert.     Sensory: Sensation is intact.     Motor: Motor function is intact.  Psychiatric:        Mood and Affect: Mood normal.     (all labs ordered are listed, but only abnormal results are displayed) Labs Reviewed - No data to display  EKG: None  Radiology: DG Elbow Complete Right Result Date: 02/13/2024 CLINICAL DATA:  Status post trauma. EXAM: RIGHT ELBOW - COMPLETE 3+ VIEW COMPARISON:  None  Available. FINDINGS: A nondisplaced fracture of indeterminate age is seen along the base of a bony spur involving the olecranon process of the proximal right ulna. There is no evidence of dislocation. Soft tissues are unremarkable. IMPRESSION: Nondisplaced fracture of indeterminate age involving a bony spur of the olecranon process of the proximal right ulna. Correlation with physical examination is recommended to determine the presence of point tenderness. Electronically Signed   By: Suzen Dials M.D.   On: 02/13/2024 19:52     Procedures   Medications Ordered in the ED  oxyCODONE -acetaminophen  (PERCOCET/ROXICET) 5-325 MG per tablet 1 tablet (1 tablet Oral Given 02/14/24 0347)    Clinical Course as of 02/14/24 0726  Fri Feb 14, 2024  0318 Tigrinian interpreter [phone]. [RS]    Clinical Course User Index [RS] Uzziel Russey, Pleasant SAUNDERS, PA-C                                 Medical Decision  Making 33 year old male with right elbow pain.  Reassuring vitals on intake.  Neurovascularly patient is intact in the hands bilaterally symmetric strength and grip.  Full active range of motion of the right elbow with pain elicited with extremity of flexion.  X-ray with nondisplaced fracture of a bony spur of the olecranon process of the right proximal ulna, patient is point tender in this location.   Amount and/or Complexity of Data Reviewed Radiology: ordered.  Risk Prescription drug management.  NO splinting warranted; sling provided for comfort. OTC analgesia recommended as well as close OP follow up.   Clincial concern for emergent underlying etiology that would warrant further ED workup or inpatient management is exceedingly low.   [name] voiced understanding of his medical evaluation and treatment plan. Each of their questions answered to their expressed satisfaction.  Return precautions were given.  Patient is well-appearing, stable, and was discharged in good condition.  This chart was dictated using voice recognition software, Dragon. Despite the best efforts of this provider to proofread and correct errors, errors may still occur which can change documentation meaning.      Final diagnoses:  Injury of right upper extremity, initial encounter    ED Discharge Orders          Ordered    oxyCODONE -acetaminophen  (PERCOCET/ROXICET) 5-325 MG tablet  Every 6 hours PRN,   Status:  Discontinued        02/14/24 0351    oxyCODONE -acetaminophen  (PERCOCET/ROXICET) 5-325 MG tablet  Every 6 hours PRN        02/14/24 0408               Layana Konkel R, PA-C 02/14/24 0725    Rik Wadel, Pleasant SAUNDERS, PA-C 02/14/24 0726    Midge Golas, MD 02/17/24 [phone]

## 2024-02-14 NOTE — Discharge Instructions (Addendum)
 You were seen in the ER today for your right elbow pain.  You were found to have a small break in that bone for which you have been placed in a sling.  You may use this for your comfort.  You may also alternate Tylenol  and ibuprofen as needed for your pain.  You may use the provided pain medication.  Follow-up with the orthopedist listed below and return to the ER with any severe symptoms.

## 2024-02-14 NOTE — Progress Notes (Signed)
 Orthopedic Tech Progress Note Patient Details:  Donald Prince 16-Apr-1990 [id number]  Ortho Devices Type of Ortho Device: Sling and swathe Ortho Device/Splint Location: R ELBOW Ortho Device/Splint Interventions: Ordered, Application, Adjustment   Post Interventions Patient Tolerated: Well Instructions Provided: Care of device  [name] [name] 02/14/2024, 4:01 AM

## 2024-03-23 ENCOUNTER — Ambulatory Visit: Payer: Self-pay | Admitting: Orthopedic Surgery
